# Patient Record
Sex: Female | Born: 1945
Health system: Southern US, Community
[De-identification: ages and names within clinical notes are randomized; demographics above are authoritative.]

## PROBLEM LIST (undated history)

## (undated) DIAGNOSIS — I10 Essential (primary) hypertension: Secondary | ICD-10-CM

## (undated) DIAGNOSIS — J302 Other seasonal allergic rhinitis: Secondary | ICD-10-CM

## (undated) HISTORY — DX: Essential (primary) hypertension: I10

## (undated) HISTORY — DX: Other seasonal allergic rhinitis: J30.2

---

## 2008-11-05 ENCOUNTER — Other Ambulatory Visit: Admission: RE | Admit: 2008-11-05 | Discharge: 2008-11-05 | Payer: Self-pay | Admitting: Family Medicine

## 2009-02-09 ENCOUNTER — Other Ambulatory Visit: Admission: RE | Admit: 2009-02-09 | Discharge: 2009-02-09 | Payer: Self-pay | Admitting: Family Medicine

## 2014-08-05 ENCOUNTER — Other Ambulatory Visit: Payer: Self-pay | Admitting: Family Medicine

## 2014-08-05 DIAGNOSIS — Z1239 Encounter for other screening for malignant neoplasm of breast: Secondary | ICD-10-CM

## 2014-08-19 ENCOUNTER — Other Ambulatory Visit: Payer: Self-pay | Admitting: Family Medicine

## 2014-08-19 DIAGNOSIS — Z1231 Encounter for screening mammogram for malignant neoplasm of breast: Secondary | ICD-10-CM

## 2014-08-25 ENCOUNTER — Ambulatory Visit
Admission: RE | Admit: 2014-08-25 | Discharge: 2014-08-25 | Disposition: A | Discharge status: Home / Self Care | Payer: Medicare Other | Source: Ambulatory Visit | Attending: Family Medicine | Admitting: Family Medicine

## 2014-08-25 DIAGNOSIS — Z1231 Encounter for screening mammogram for malignant neoplasm of breast: Secondary | ICD-10-CM

## 2014-08-27 ENCOUNTER — Other Ambulatory Visit: Payer: Self-pay | Admitting: Family Medicine

## 2014-08-27 DIAGNOSIS — R928 Other abnormal and inconclusive findings on diagnostic imaging of breast: Secondary | ICD-10-CM

## 2014-09-07 ENCOUNTER — Other Ambulatory Visit: Payer: Self-pay | Admitting: Orthopaedic Surgery

## 2014-09-07 DIAGNOSIS — M25512 Pain in left shoulder: Secondary | ICD-10-CM

## 2014-09-10 ENCOUNTER — Ambulatory Visit
Admission: RE | Admit: 2014-09-10 | Discharge: 2014-09-10 | Disposition: A | Discharge status: Home / Self Care | Payer: Medicare Other | Source: Ambulatory Visit | Attending: Family Medicine | Admitting: Family Medicine

## 2014-09-10 ENCOUNTER — Other Ambulatory Visit: Payer: Self-pay | Admitting: Family Medicine

## 2014-09-10 DIAGNOSIS — R928 Other abnormal and inconclusive findings on diagnostic imaging of breast: Secondary | ICD-10-CM

## 2014-09-22 ENCOUNTER — Ambulatory Visit
Admission: RE | Admit: 2014-09-22 | Discharge: 2014-09-22 | Disposition: A | Discharge status: Home / Self Care | Payer: Medicare Other | Source: Ambulatory Visit | Attending: Family Medicine | Admitting: Family Medicine

## 2014-09-22 DIAGNOSIS — R928 Other abnormal and inconclusive findings on diagnostic imaging of breast: Secondary | ICD-10-CM

## 2014-10-04 ENCOUNTER — Ambulatory Visit
Admission: RE | Admit: 2014-10-04 | Discharge: 2014-10-04 | Disposition: A | Discharge status: Home / Self Care | Payer: Medicare Other | Source: Ambulatory Visit | Attending: Orthopaedic Surgery | Admitting: Orthopaedic Surgery

## 2014-10-04 DIAGNOSIS — M25512 Pain in left shoulder: Secondary | ICD-10-CM

## 2015-06-20 ENCOUNTER — Other Ambulatory Visit: Payer: Self-pay | Admitting: Family Medicine

## 2015-06-20 DIAGNOSIS — N6489 Other specified disorders of breast: Secondary | ICD-10-CM

## 2015-06-20 DIAGNOSIS — R921 Mammographic calcification found on diagnostic imaging of breast: Secondary | ICD-10-CM

## 2015-06-24 ENCOUNTER — Ambulatory Visit
Admission: RE | Admit: 2015-06-24 | Discharge: 2015-06-24 | Disposition: A | Discharge status: Home / Self Care | Payer: Medicare Other | Source: Ambulatory Visit | Attending: Family Medicine | Admitting: Family Medicine

## 2015-06-24 ENCOUNTER — Other Ambulatory Visit: Payer: Self-pay | Admitting: Family Medicine

## 2015-06-24 DIAGNOSIS — N6489 Other specified disorders of breast: Secondary | ICD-10-CM

## 2015-06-24 DIAGNOSIS — R921 Mammographic calcification found on diagnostic imaging of breast: Secondary | ICD-10-CM

## 2015-08-30 ENCOUNTER — Other Ambulatory Visit: Payer: Self-pay | Admitting: Family Medicine

## 2015-08-30 ENCOUNTER — Other Ambulatory Visit: Payer: Self-pay

## 2015-08-30 DIAGNOSIS — N6489 Other specified disorders of breast: Secondary | ICD-10-CM

## 2015-09-30 ENCOUNTER — Ambulatory Visit
Admission: RE | Admit: 2015-09-30 | Discharge: 2015-09-30 | Disposition: A | Discharge status: Home / Self Care | Payer: Medicare Other | Source: Ambulatory Visit | Attending: Family Medicine | Admitting: Family Medicine

## 2015-09-30 DIAGNOSIS — N6489 Other specified disorders of breast: Secondary | ICD-10-CM

## 2015-11-01 DIAGNOSIS — M25562 Pain in left knee: Secondary | ICD-10-CM | POA: Diagnosis not present

## 2015-11-01 DIAGNOSIS — M79672 Pain in left foot: Secondary | ICD-10-CM | POA: Diagnosis not present

## 2015-11-01 DIAGNOSIS — M7552 Bursitis of left shoulder: Secondary | ICD-10-CM | POA: Diagnosis not present

## 2015-11-16 DIAGNOSIS — M25562 Pain in left knee: Secondary | ICD-10-CM | POA: Diagnosis not present

## 2015-12-16 DIAGNOSIS — M25562 Pain in left knee: Secondary | ICD-10-CM | POA: Diagnosis not present

## 2016-01-10 DIAGNOSIS — M7062 Trochanteric bursitis, left hip: Secondary | ICD-10-CM | POA: Diagnosis not present

## 2016-01-13 ENCOUNTER — Other Ambulatory Visit: Payer: Self-pay | Admitting: Orthopaedic Surgery

## 2016-01-13 DIAGNOSIS — R200 Anesthesia of skin: Secondary | ICD-10-CM

## 2016-01-16 ENCOUNTER — Other Ambulatory Visit: Payer: Self-pay | Admitting: Orthopaedic Surgery

## 2016-01-16 DIAGNOSIS — M25562 Pain in left knee: Secondary | ICD-10-CM

## 2016-01-20 ENCOUNTER — Ambulatory Visit
Admission: RE | Admit: 2016-01-20 | Discharge: 2016-01-20 | Disposition: A | Discharge status: Home / Self Care | Payer: PPO | Source: Ambulatory Visit | Attending: Orthopaedic Surgery | Admitting: Orthopaedic Surgery

## 2016-01-20 DIAGNOSIS — L608 Other nail disorders: Secondary | ICD-10-CM | POA: Diagnosis not present

## 2016-01-20 DIAGNOSIS — R200 Anesthesia of skin: Secondary | ICD-10-CM

## 2016-01-27 ENCOUNTER — Ambulatory Visit
Admission: RE | Admit: 2016-01-27 | Discharge: 2016-01-27 | Disposition: A | Discharge status: Home / Self Care | Payer: PPO | Source: Ambulatory Visit | Attending: Orthopaedic Surgery | Admitting: Orthopaedic Surgery

## 2016-01-27 DIAGNOSIS — M25562 Pain in left knee: Secondary | ICD-10-CM

## 2016-01-27 DIAGNOSIS — S83242A Other tear of medial meniscus, current injury, left knee, initial encounter: Secondary | ICD-10-CM | POA: Diagnosis not present

## 2016-02-03 DIAGNOSIS — M79672 Pain in left foot: Secondary | ICD-10-CM | POA: Diagnosis not present

## 2016-02-03 DIAGNOSIS — M1712 Unilateral primary osteoarthritis, left knee: Secondary | ICD-10-CM | POA: Diagnosis not present

## 2016-02-03 DIAGNOSIS — M25562 Pain in left knee: Secondary | ICD-10-CM | POA: Diagnosis not present

## 2016-02-03 DIAGNOSIS — M23322 Other meniscus derangements, posterior horn of medial meniscus, left knee: Secondary | ICD-10-CM | POA: Diagnosis not present

## 2016-02-13 DIAGNOSIS — R7303 Prediabetes: Secondary | ICD-10-CM | POA: Diagnosis not present

## 2016-02-13 DIAGNOSIS — F39 Unspecified mood [affective] disorder: Secondary | ICD-10-CM | POA: Diagnosis not present

## 2016-02-13 DIAGNOSIS — I10 Essential (primary) hypertension: Secondary | ICD-10-CM | POA: Diagnosis not present

## 2016-02-13 DIAGNOSIS — Z1159 Encounter for screening for other viral diseases: Secondary | ICD-10-CM | POA: Diagnosis not present

## 2016-02-13 DIAGNOSIS — E7800 Pure hypercholesterolemia, unspecified: Secondary | ICD-10-CM | POA: Diagnosis not present

## 2016-02-13 DIAGNOSIS — K219 Gastro-esophageal reflux disease without esophagitis: Secondary | ICD-10-CM | POA: Diagnosis not present

## 2016-02-13 DIAGNOSIS — R7309 Other abnormal glucose: Secondary | ICD-10-CM | POA: Diagnosis not present

## 2016-05-29 DIAGNOSIS — J329 Chronic sinusitis, unspecified: Secondary | ICD-10-CM | POA: Diagnosis not present

## 2016-09-04 ENCOUNTER — Other Ambulatory Visit: Payer: Self-pay | Admitting: Family Medicine

## 2016-09-04 DIAGNOSIS — F39 Unspecified mood [affective] disorder: Secondary | ICD-10-CM | POA: Diagnosis not present

## 2016-09-04 DIAGNOSIS — Z1231 Encounter for screening mammogram for malignant neoplasm of breast: Secondary | ICD-10-CM

## 2016-09-04 DIAGNOSIS — J309 Allergic rhinitis, unspecified: Secondary | ICD-10-CM | POA: Diagnosis not present

## 2016-09-04 DIAGNOSIS — K219 Gastro-esophageal reflux disease without esophagitis: Secondary | ICD-10-CM | POA: Diagnosis not present

## 2016-09-04 DIAGNOSIS — Z23 Encounter for immunization: Secondary | ICD-10-CM | POA: Diagnosis not present

## 2016-09-04 DIAGNOSIS — I10 Essential (primary) hypertension: Secondary | ICD-10-CM | POA: Diagnosis not present

## 2016-09-04 DIAGNOSIS — R7303 Prediabetes: Secondary | ICD-10-CM | POA: Diagnosis not present

## 2016-09-04 DIAGNOSIS — E7800 Pure hypercholesterolemia, unspecified: Secondary | ICD-10-CM | POA: Diagnosis not present

## 2016-09-04 DIAGNOSIS — N951 Menopausal and female climacteric states: Secondary | ICD-10-CM | POA: Diagnosis not present

## 2016-09-04 DIAGNOSIS — Z0000 Encounter for general adult medical examination without abnormal findings: Secondary | ICD-10-CM | POA: Diagnosis not present

## 2016-10-08 ENCOUNTER — Ambulatory Visit: Payer: PPO

## 2016-10-27 DIAGNOSIS — Z79899 Other long term (current) drug therapy: Secondary | ICD-10-CM | POA: Diagnosis not present

## 2016-10-27 DIAGNOSIS — R21 Rash and other nonspecific skin eruption: Secondary | ICD-10-CM | POA: Diagnosis not present

## 2016-10-27 DIAGNOSIS — I10 Essential (primary) hypertension: Secondary | ICD-10-CM | POA: Diagnosis not present

## 2016-10-27 DIAGNOSIS — K219 Gastro-esophageal reflux disease without esophagitis: Secondary | ICD-10-CM | POA: Diagnosis not present

## 2016-10-27 DIAGNOSIS — L509 Urticaria, unspecified: Secondary | ICD-10-CM | POA: Diagnosis not present

## 2016-10-27 DIAGNOSIS — E7800 Pure hypercholesterolemia, unspecified: Secondary | ICD-10-CM | POA: Diagnosis not present

## 2016-11-05 DIAGNOSIS — L509 Urticaria, unspecified: Secondary | ICD-10-CM | POA: Diagnosis not present

## 2016-11-09 ENCOUNTER — Ambulatory Visit
Admission: RE | Admit: 2016-11-09 | Discharge: 2016-11-09 | Disposition: A | Discharge status: Home / Self Care | Payer: PPO | Source: Ambulatory Visit | Attending: Family Medicine | Admitting: Family Medicine

## 2016-11-09 DIAGNOSIS — Z1231 Encounter for screening mammogram for malignant neoplasm of breast: Secondary | ICD-10-CM

## 2016-11-15 ENCOUNTER — Other Ambulatory Visit: Payer: Self-pay | Admitting: Family Medicine

## 2016-11-15 DIAGNOSIS — R928 Other abnormal and inconclusive findings on diagnostic imaging of breast: Secondary | ICD-10-CM

## 2016-11-19 ENCOUNTER — Ambulatory Visit
Admission: RE | Admit: 2016-11-19 | Discharge: 2016-11-19 | Disposition: A | Discharge status: Home / Self Care | Payer: PPO | Source: Ambulatory Visit | Attending: Family Medicine | Admitting: Family Medicine

## 2016-11-19 DIAGNOSIS — R928 Other abnormal and inconclusive findings on diagnostic imaging of breast: Secondary | ICD-10-CM

## 2016-11-19 DIAGNOSIS — R922 Inconclusive mammogram: Secondary | ICD-10-CM | POA: Diagnosis not present

## 2016-11-28 DIAGNOSIS — J069 Acute upper respiratory infection, unspecified: Secondary | ICD-10-CM | POA: Diagnosis not present

## 2017-02-06 ENCOUNTER — Ambulatory Visit (INDEPENDENT_AMBULATORY_CARE_PROVIDER_SITE_OTHER): Payer: PPO

## 2017-02-06 ENCOUNTER — Encounter (INDEPENDENT_AMBULATORY_CARE_PROVIDER_SITE_OTHER): Payer: Self-pay | Admitting: Orthopedic Surgery

## 2017-02-06 ENCOUNTER — Ambulatory Visit (INDEPENDENT_AMBULATORY_CARE_PROVIDER_SITE_OTHER): Payer: PPO | Admitting: Orthopedic Surgery

## 2017-02-06 VITALS — BP 142/79 | HR 69 | Resp 14 | Ht 60.5 in | Wt 200.0 lb

## 2017-02-06 DIAGNOSIS — M25561 Pain in right knee: Secondary | ICD-10-CM | POA: Diagnosis not present

## 2017-02-06 DIAGNOSIS — M25571 Pain in right ankle and joints of right foot: Secondary | ICD-10-CM

## 2017-02-06 DIAGNOSIS — M25572 Pain in left ankle and joints of left foot: Secondary | ICD-10-CM

## 2017-02-06 MED ORDER — BUPIVACAINE HCL 0.5 % IJ SOLN
3.0000 mL | INTRAMUSCULAR | Status: AC | PRN
  Administered 2017-02-06: 3 mL via INTRA_ARTICULAR

## 2017-02-06 MED ORDER — METHYLPREDNISOLONE ACETATE 40 MG/ML IJ SUSP
80.0000 mg | INTRAMUSCULAR | Status: AC | PRN
  Administered 2017-02-06: 80 mg

## 2017-02-06 MED ORDER — LIDOCAINE HCL 1 % IJ SOLN
5.0000 mL | INTRAMUSCULAR | Status: AC | PRN
  Administered 2017-02-06: 5 mL

## 2017-02-06 NOTE — Progress Notes (Signed)
Office Visit Note   Patient: Evelyn Stevens           Date of Birth: 04/23/1946           MRN: [id number] Visit Date: 02/06/2017              Requested by: No referring provider defined for this encounter. PCP: Osborne Casco, MD   Assessment & Plan: Visit Diagnoses:  1. Acute pain of right knee     Plan:  #1: Corticosteroid injection to the right knee which was accomplished atraumatically #2: If this is not having any improvement then she can call and we'll get an MRI scan #3: She does have a set of exercises that she had for her left knee and she has been trying to do those and will continue.  Follow-Up Instructions: Return if symptoms worsen or fail to improve.   Orders:  Orders Placed This Encounter  Procedures  . XR Knee Complete 4 Views Right   No orders of the defined types were placed in this encounter.     Procedures: Large Joint Inj Date/Time: 02/06/2017 5:10 PM Performed by: Biagio Borg D Authorized by: Biagio Borg D   Consent Given by:  Patient Timeout: prior to procedure the correct patient, procedure, and site was verified   Indications:  Pain and joint swelling Location:  Knee Site:  R knee Prep: patient was prepped and draped in usual sterile fashion   Needle Size:  25 G Needle Length:  1.5 inches Approach:  Anteromedial Ultrasound Guidance: No   Fluoroscopic Guidance: No   Arthrogram: No   Medications:  5 mL lidocaine 1 %; 80 mg methylPREDNISolone acetate 40 MG/ML; 3 mL bupivacaine 0.5 % Aspiration Attempted: No   Patient tolerance:  Patient tolerated the procedure well with no immediate complications     Clinical Data: No additional findings.   Subjective: Chief Complaint  Patient presents with  . Right Knee - Pain    Thank you is a 71 year old white female who is seen today for evaluation of her right knee. Apparently 10 days ago Friday she was walking down a flight of stairs at the movie theater and the one-step  was a little bit different and she came down very hard on her right knee. She really didn't remember anything at that time her pain however by the next morning she was having significant pain and discomfort in the right knee. This had improved Improved however on Friday, she was coming down her on steps and had an episode again of pain discomfort in the knee. She has now been using a 4 pronged cane to walk down the flight of stairs and she does not have any banisters. She continues to have pain laterally and some medially and posteriorly. She states though it is feeling some better today.    Review of Systems  Constitutional: Negative for fever. HENT: Negative for sore throat. Eyes: Negative for visual changes. Cardiovascular: Negative for chest pain. Respiratory: Negative for shortness of breath. Gastrointestinal: Negative for abdominal pain, vomiting or diarrhea. Genitourinary: Negative for dysuria. Musculoskeletal: Negative for back pain. Skin: Positive for irritation to skin on the face and neck. Neurological: Negative for headaches, weakness or numbness.     Objective: Vital Signs: BP (!) 142/79   Pulse 69   Resp 14   Ht 5' 0.5" (1.537 m)   Wt 200 lb (90.7 kg)   BMI 38.42 kg/m   Physical Exam  Constitutional: She is oriented to  person, place, and time. She appears well-developed and well-nourished.  HENT:  Head: Normocephalic and atraumatic.  Eyes: EOM are normal. Pupils are equal, round, and reactive to light.  Pulmonary/Chest: Effort normal.  Neurological: She is alert and oriented to person, place, and time.  Skin: Skin is warm and dry.  Psychiatric: She has a normal mood and affect. Her behavior is normal. Judgment and thought content normal.    Right Knee Exam   Tenderness  The patient is experiencing tenderness in the lateral joint line and medial joint line.  Range of Motion  Extension: 0  Flexion: 100   Tests  Varus: negative Valgus: negative  Other    Sensation: normal  Comments:  She has limited tenderness more laterally than medially. Little bit of crepitus with range of motion at the patellofemoral area. I cannot tell if she has no effusion or not because the size of her knee. Appears ligamentously stable.      Specialty Comments:  No specialty comments available.  Imaging: Xr Knee Complete 4 Views Right  Result Date: 02/06/2017 Three-view x-ray of the right knee reveals maintenance of the joint lines both medially and laterally. A little bit sclerosing along the medial tibial plateau. Does have little bit of spurring along the medial femoral condyle on the sunrise view.    PMFS History: There are no active problems to display for this patient.  Past Medical History:  Diagnosis Date  . Hypertension   . Seasonal allergies     No family history on file.  History reviewed. No pertinent surgical history. Social History   Occupational History  . Not on file.   Social History Main Topics  . Smoking status: Never Smoker  . Smokeless tobacco: Never Used  . Alcohol use No  . Drug use: No  . Sexual activity: Not on file

## 2017-03-08 DIAGNOSIS — J309 Allergic rhinitis, unspecified: Secondary | ICD-10-CM | POA: Diagnosis not present

## 2017-03-08 DIAGNOSIS — E7800 Pure hypercholesterolemia, unspecified: Secondary | ICD-10-CM | POA: Diagnosis not present

## 2017-03-08 DIAGNOSIS — R7303 Prediabetes: Secondary | ICD-10-CM | POA: Diagnosis not present

## 2017-03-08 DIAGNOSIS — K219 Gastro-esophageal reflux disease without esophagitis: Secondary | ICD-10-CM | POA: Diagnosis not present

## 2017-03-08 DIAGNOSIS — F39 Unspecified mood [affective] disorder: Secondary | ICD-10-CM | POA: Diagnosis not present

## 2017-03-08 DIAGNOSIS — I10 Essential (primary) hypertension: Secondary | ICD-10-CM | POA: Diagnosis not present

## 2017-04-17 DIAGNOSIS — L918 Other hypertrophic disorders of the skin: Secondary | ICD-10-CM | POA: Diagnosis not present

## 2017-04-17 DIAGNOSIS — L249 Irritant contact dermatitis, unspecified cause: Secondary | ICD-10-CM | POA: Diagnosis not present

## 2017-04-17 DIAGNOSIS — L814 Other melanin hyperpigmentation: Secondary | ICD-10-CM | POA: Diagnosis not present

## 2017-04-17 DIAGNOSIS — L820 Inflamed seborrheic keratosis: Secondary | ICD-10-CM | POA: Diagnosis not present

## 2017-04-17 DIAGNOSIS — D225 Melanocytic nevi of trunk: Secondary | ICD-10-CM | POA: Diagnosis not present

## 2017-05-30 ENCOUNTER — Other Ambulatory Visit: Payer: Self-pay | Admitting: Physician Assistant

## 2017-05-30 ENCOUNTER — Ambulatory Visit
Admission: RE | Admit: 2017-05-30 | Discharge: 2017-05-30 | Disposition: A | Discharge status: Home / Self Care | Payer: PPO | Source: Ambulatory Visit | Attending: Physician Assistant | Admitting: Physician Assistant

## 2017-05-30 DIAGNOSIS — R05 Cough: Secondary | ICD-10-CM

## 2017-05-30 DIAGNOSIS — R053 Chronic cough: Secondary | ICD-10-CM

## 2017-05-30 DIAGNOSIS — J310 Chronic rhinitis: Secondary | ICD-10-CM | POA: Diagnosis not present

## 2017-06-06 DIAGNOSIS — J209 Acute bronchitis, unspecified: Secondary | ICD-10-CM | POA: Diagnosis not present

## 2017-09-13 DIAGNOSIS — I129 Hypertensive chronic kidney disease with stage 1 through stage 4 chronic kidney disease, or unspecified chronic kidney disease: Secondary | ICD-10-CM | POA: Diagnosis not present

## 2017-09-13 DIAGNOSIS — R7303 Prediabetes: Secondary | ICD-10-CM | POA: Diagnosis not present

## 2017-09-13 DIAGNOSIS — Z23 Encounter for immunization: Secondary | ICD-10-CM | POA: Diagnosis not present

## 2017-09-13 DIAGNOSIS — F39 Unspecified mood [affective] disorder: Secondary | ICD-10-CM | POA: Diagnosis not present

## 2017-09-13 DIAGNOSIS — E7800 Pure hypercholesterolemia, unspecified: Secondary | ICD-10-CM | POA: Diagnosis not present

## 2017-09-13 DIAGNOSIS — Z0000 Encounter for general adult medical examination without abnormal findings: Secondary | ICD-10-CM | POA: Diagnosis not present

## 2017-09-13 DIAGNOSIS — N183 Chronic kidney disease, stage 3 (moderate): Secondary | ICD-10-CM | POA: Diagnosis not present

## 2017-09-13 DIAGNOSIS — J309 Allergic rhinitis, unspecified: Secondary | ICD-10-CM | POA: Diagnosis not present

## 2017-10-28 ENCOUNTER — Telehealth (INDEPENDENT_AMBULATORY_CARE_PROVIDER_SITE_OTHER): Payer: Self-pay | Admitting: Orthopaedic Surgery

## 2017-10-28 NOTE — Telephone Encounter (Signed)
Patient left a message requesting an additional pair of orthotics.  She wants to know if she needs to make an appointment for this or

## 2017-10-28 NOTE — Telephone Encounter (Signed)
Is insurance billed or does she need to pay for them when she comes in.  CB# [phone]  Please advise

## 2017-10-30 ENCOUNTER — Telehealth (INDEPENDENT_AMBULATORY_CARE_PROVIDER_SITE_OTHER): Payer: Self-pay | Admitting: Orthopaedic Surgery

## 2017-10-30 NOTE — Telephone Encounter (Signed)
OK per Dr. Durward Fortes, [facility] to call [facility] and ask to schedule

## 2017-10-30 NOTE — Telephone Encounter (Signed)
Patient would like a new rx for Orthotics. Old pair is wearing out. Please call to advise.

## 2017-10-30 NOTE — Telephone Encounter (Signed)
OK to do-

## 2017-10-30 NOTE — Telephone Encounter (Signed)
yes

## 2017-11-04 ENCOUNTER — Other Ambulatory Visit (INDEPENDENT_AMBULATORY_CARE_PROVIDER_SITE_OTHER): Payer: Self-pay | Admitting: *Deleted

## 2017-11-04 DIAGNOSIS — M25571 Pain in right ankle and joints of right foot: Secondary | ICD-10-CM | POA: Diagnosis not present

## 2017-11-04 DIAGNOSIS — M25572 Pain in left ankle and joints of left foot: Secondary | ICD-10-CM

## 2017-11-05 ENCOUNTER — Other Ambulatory Visit (INDEPENDENT_AMBULATORY_CARE_PROVIDER_SITE_OTHER): Payer: Self-pay | Admitting: *Deleted

## 2017-11-05 ENCOUNTER — Other Ambulatory Visit (INDEPENDENT_AMBULATORY_CARE_PROVIDER_SITE_OTHER): Payer: Self-pay

## 2017-11-19 DIAGNOSIS — M8588 Other specified disorders of bone density and structure, other site: Secondary | ICD-10-CM | POA: Diagnosis not present

## 2017-11-25 ENCOUNTER — Other Ambulatory Visit: Payer: Self-pay | Admitting: Family Medicine

## 2017-11-25 DIAGNOSIS — Z139 Encounter for screening, unspecified: Secondary | ICD-10-CM

## 2017-12-11 ENCOUNTER — Encounter (INDEPENDENT_AMBULATORY_CARE_PROVIDER_SITE_OTHER): Payer: Self-pay

## 2017-12-11 ENCOUNTER — Ambulatory Visit
Admission: RE | Admit: 2017-12-11 | Discharge: 2017-12-11 | Disposition: A | Discharge status: Home / Self Care | Payer: PPO | Source: Ambulatory Visit | Attending: Family Medicine | Admitting: Family Medicine

## 2017-12-11 DIAGNOSIS — Z139 Encounter for screening, unspecified: Secondary | ICD-10-CM

## 2017-12-11 DIAGNOSIS — Z1231 Encounter for screening mammogram for malignant neoplasm of breast: Secondary | ICD-10-CM | POA: Diagnosis not present

## 2017-12-15 IMAGING — MG 2D DIGITAL DIAGNOSTIC UNILATERAL RIGHT MAMMOGRAM WITH CAD AND AD
6 of 9 series · 6 of 25 positions shown · non-contrast
Comparison: Previous exam(s).

CLINICAL DATA: 70-year-old female, callback from screening
mammogram for possible right breast distortion

EXAM:
2D DIGITAL DIAGNOSTIC UNILATERAL RIGHT MAMMOGRAM WITH CAD AND
ADJUNCT TOMO

[R MLO]
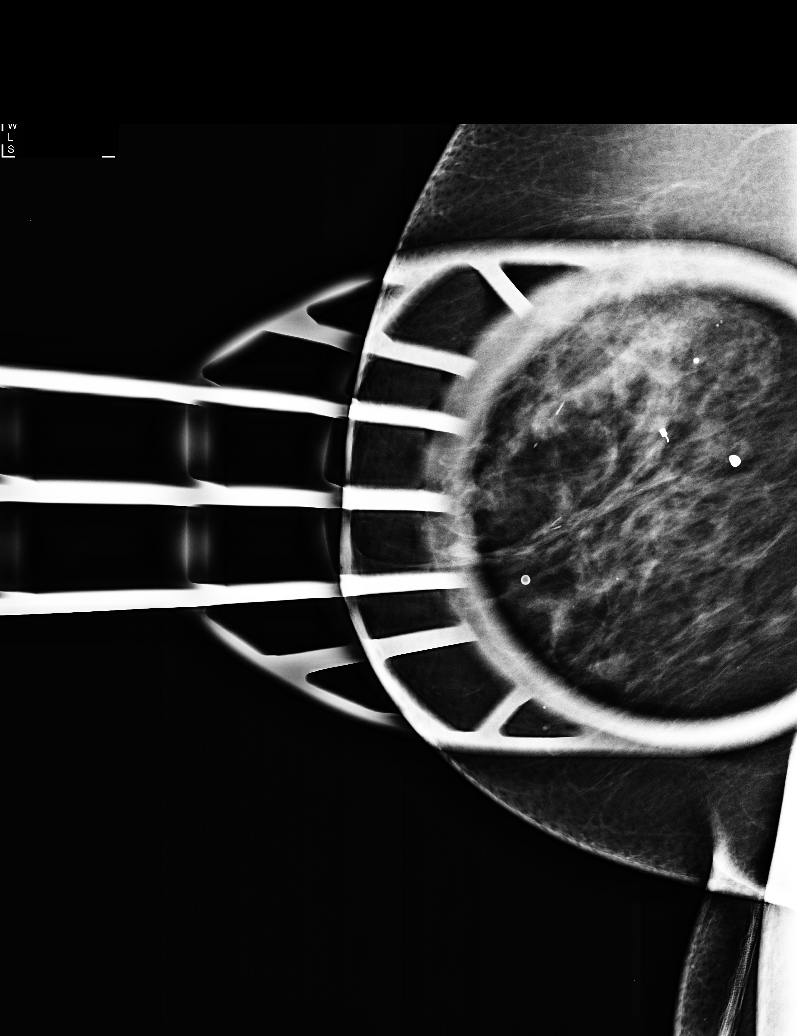

[R CC (1 of 2)]
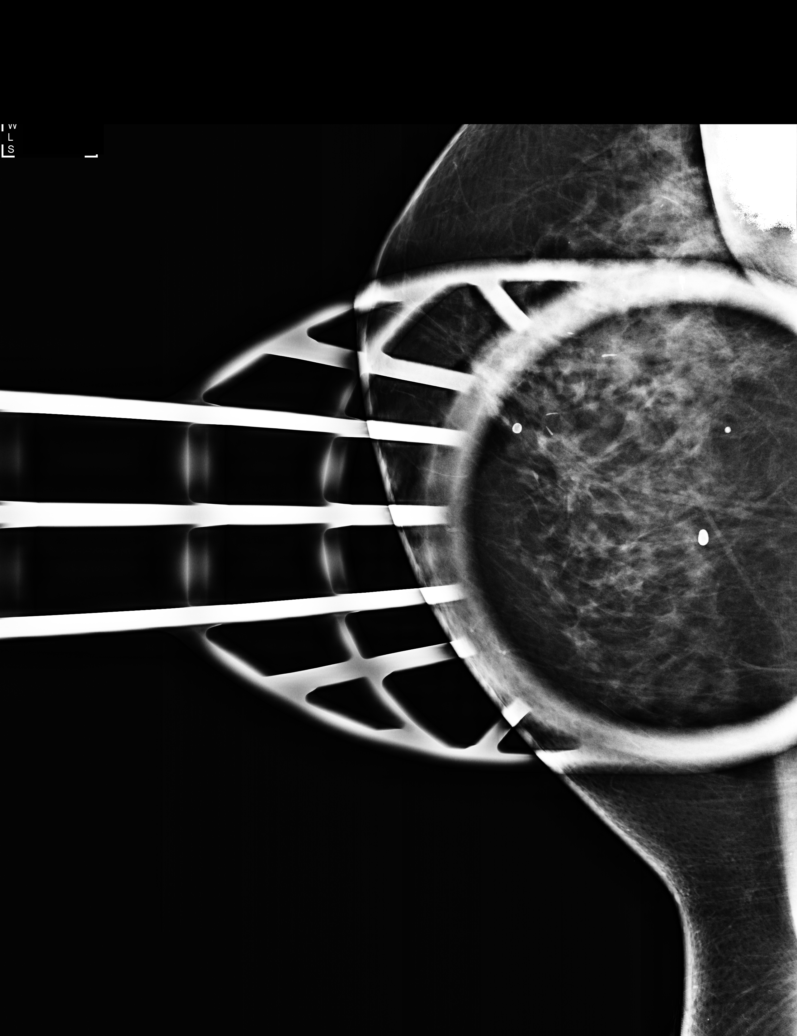

[R ML synth-2D]
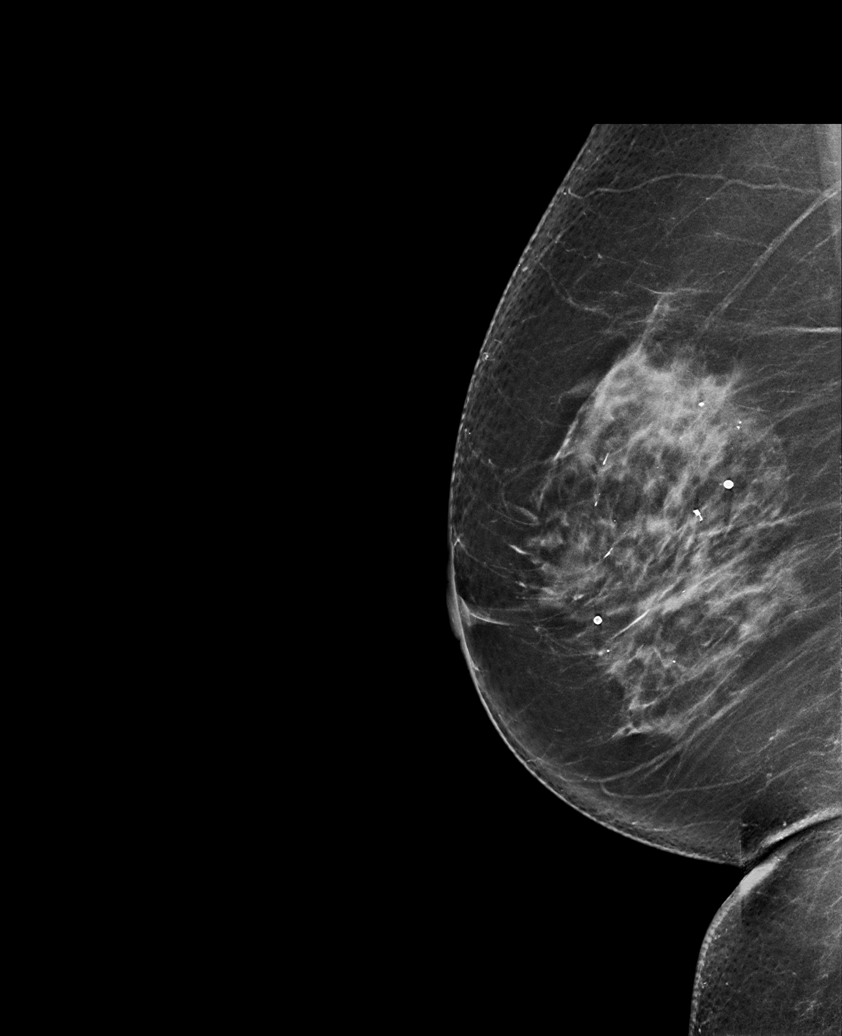

[R ML]
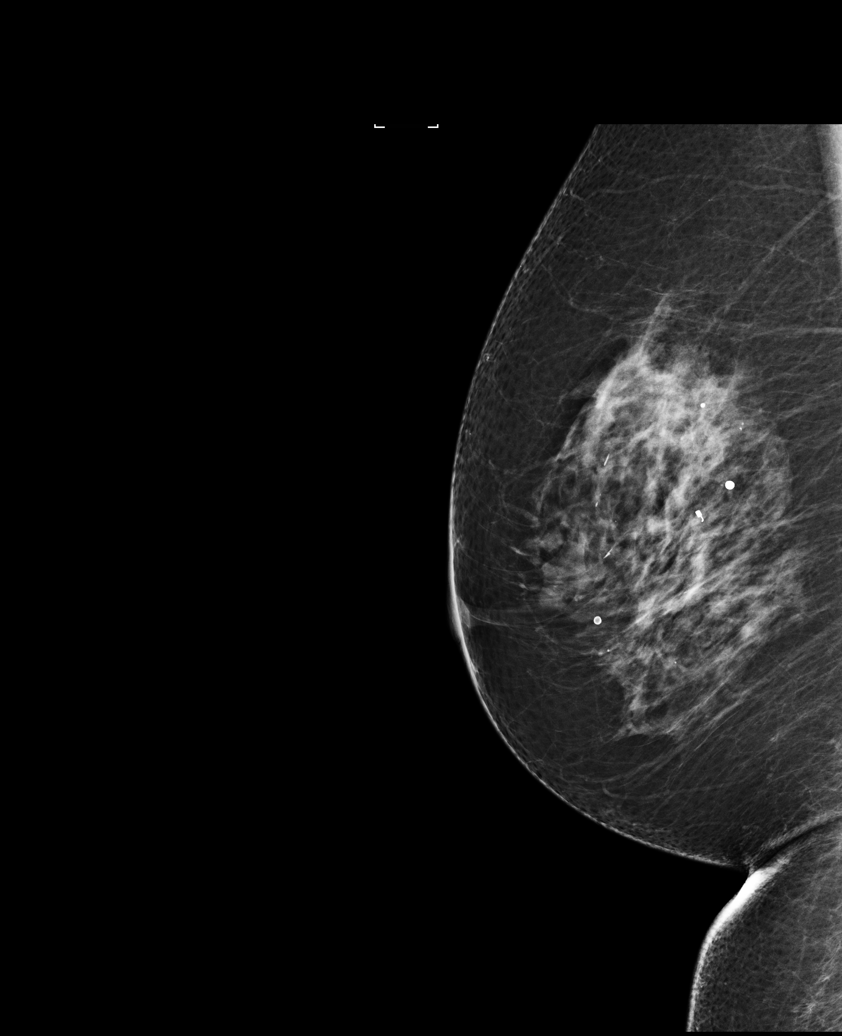

[R CC (2 of 2)]
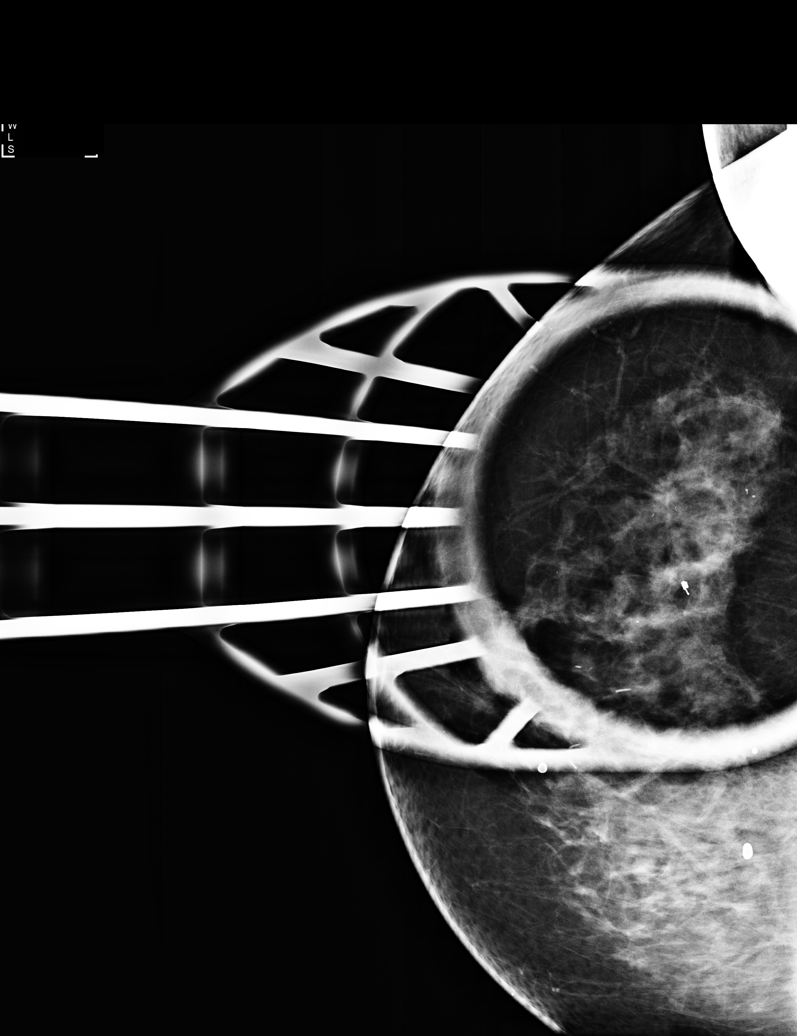

[R CC tomo · tomo slice 43/84.0]
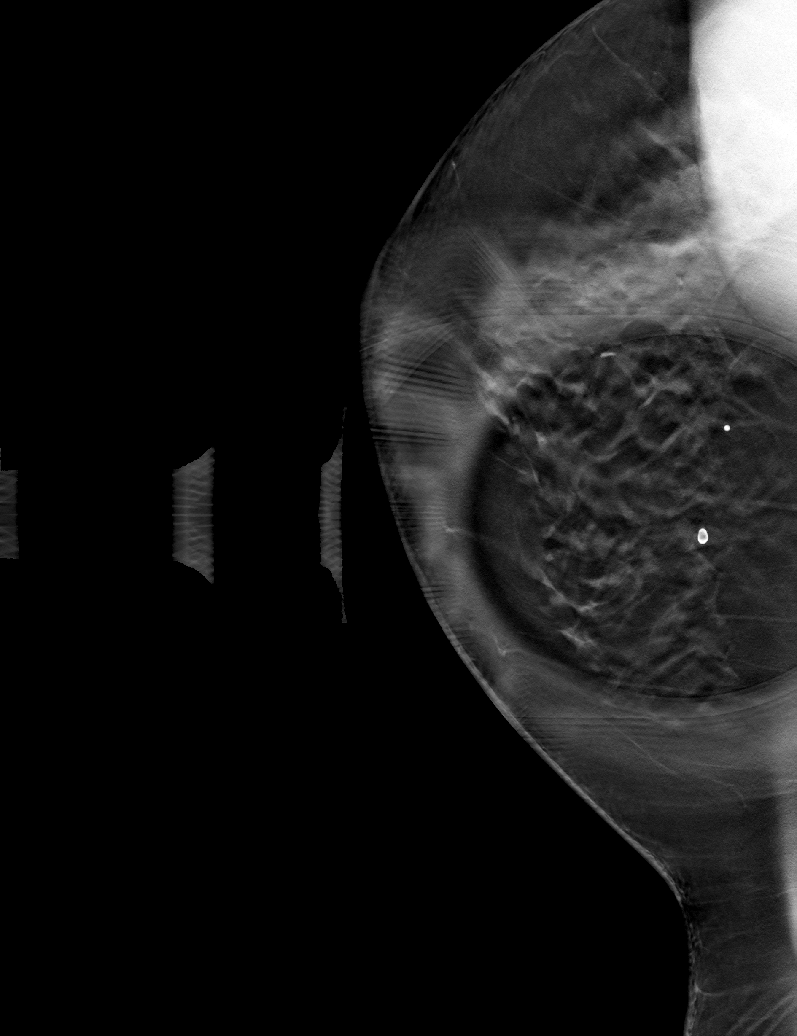

[6 of 25 positions shown; findings below may reference images not displayed]

ACR Breast Density Category c: The breast tissue is heterogeneously
dense, which may obscure small masses.
FINDINGS: Cc and MLO spot-compression and full lateral views of the right
breast were performed with tomosynthesis. No suspicious abnormality
is identified on the additional views. The area of concern on the CC
view of the screening mammogram mammogram corresponds to the site of
prior benign stereotactic biopsy. The biopsy marker was noted on the
post biopsy mammogram to be medial to the biopsy site.

Mammographic images were processed with CAD.
IMPRESSION: No mammographic evidence of malignancy.

RECOMMENDATION:
Screening mammogram in one year.(Code:92-[id number])

I have discussed the findings and recommendations with the patient.
Results were also provided in writing at the conclusion of the
visit. If applicable, a reminder letter will be sent to the patient
regarding the next appointment.

BI-RADS CATEGORY  2: Benign.

## 2018-03-18 DIAGNOSIS — E7800 Pure hypercholesterolemia, unspecified: Secondary | ICD-10-CM | POA: Diagnosis not present

## 2018-03-18 DIAGNOSIS — R7303 Prediabetes: Secondary | ICD-10-CM | POA: Diagnosis not present

## 2018-03-18 DIAGNOSIS — I129 Hypertensive chronic kidney disease with stage 1 through stage 4 chronic kidney disease, or unspecified chronic kidney disease: Secondary | ICD-10-CM | POA: Diagnosis not present

## 2018-03-18 DIAGNOSIS — N183 Chronic kidney disease, stage 3 (moderate): Secondary | ICD-10-CM | POA: Diagnosis not present

## 2018-03-31 DIAGNOSIS — L509 Urticaria, unspecified: Secondary | ICD-10-CM | POA: Diagnosis not present

## 2018-04-28 DIAGNOSIS — L821 Other seborrheic keratosis: Secondary | ICD-10-CM | POA: Diagnosis not present

## 2018-04-28 DIAGNOSIS — L570 Actinic keratosis: Secondary | ICD-10-CM | POA: Diagnosis not present

## 2018-04-28 DIAGNOSIS — L819 Disorder of pigmentation, unspecified: Secondary | ICD-10-CM | POA: Diagnosis not present

## 2018-05-06 DIAGNOSIS — H33321 Round hole, right eye: Secondary | ICD-10-CM | POA: Diagnosis not present

## 2018-05-06 DIAGNOSIS — H25813 Combined forms of age-related cataract, bilateral: Secondary | ICD-10-CM | POA: Diagnosis not present

## 2018-05-06 DIAGNOSIS — H04123 Dry eye syndrome of bilateral lacrimal glands: Secondary | ICD-10-CM | POA: Diagnosis not present

## 2018-05-06 DIAGNOSIS — E119 Type 2 diabetes mellitus without complications: Secondary | ICD-10-CM | POA: Diagnosis not present

## 2018-05-06 DIAGNOSIS — H10413 Chronic giant papillary conjunctivitis, bilateral: Secondary | ICD-10-CM | POA: Diagnosis not present

## 2018-05-09 ENCOUNTER — Encounter (INDEPENDENT_AMBULATORY_CARE_PROVIDER_SITE_OTHER): Payer: PPO | Admitting: Ophthalmology

## 2018-05-09 DIAGNOSIS — H2513 Age-related nuclear cataract, bilateral: Secondary | ICD-10-CM

## 2018-05-09 DIAGNOSIS — H43813 Vitreous degeneration, bilateral: Secondary | ICD-10-CM

## 2018-05-09 DIAGNOSIS — H35033 Hypertensive retinopathy, bilateral: Secondary | ICD-10-CM | POA: Diagnosis not present

## 2018-05-09 DIAGNOSIS — I10 Essential (primary) hypertension: Secondary | ICD-10-CM | POA: Diagnosis not present

## 2018-05-09 DIAGNOSIS — H33021 Retinal detachment with multiple breaks, right eye: Secondary | ICD-10-CM | POA: Diagnosis not present

## 2018-05-16 ENCOUNTER — Encounter (INDEPENDENT_AMBULATORY_CARE_PROVIDER_SITE_OTHER): Payer: PPO | Admitting: Ophthalmology

## 2018-05-16 DIAGNOSIS — H33301 Unspecified retinal break, right eye: Secondary | ICD-10-CM

## 2018-06-06 ENCOUNTER — Encounter (INDEPENDENT_AMBULATORY_CARE_PROVIDER_SITE_OTHER): Payer: PPO | Admitting: Ophthalmology

## 2018-06-06 DIAGNOSIS — H33301 Unspecified retinal break, right eye: Secondary | ICD-10-CM

## 2018-09-16 DIAGNOSIS — Z0000 Encounter for general adult medical examination without abnormal findings: Secondary | ICD-10-CM | POA: Diagnosis not present

## 2018-09-16 DIAGNOSIS — N183 Chronic kidney disease, stage 3 (moderate): Secondary | ICD-10-CM | POA: Diagnosis not present

## 2018-09-16 DIAGNOSIS — E7800 Pure hypercholesterolemia, unspecified: Secondary | ICD-10-CM | POA: Diagnosis not present

## 2018-09-16 DIAGNOSIS — I129 Hypertensive chronic kidney disease with stage 1 through stage 4 chronic kidney disease, or unspecified chronic kidney disease: Secondary | ICD-10-CM | POA: Diagnosis not present

## 2018-09-16 DIAGNOSIS — Z1389 Encounter for screening for other disorder: Secondary | ICD-10-CM | POA: Diagnosis not present

## 2018-09-16 DIAGNOSIS — J309 Allergic rhinitis, unspecified: Secondary | ICD-10-CM | POA: Diagnosis not present

## 2018-09-16 DIAGNOSIS — F39 Unspecified mood [affective] disorder: Secondary | ICD-10-CM | POA: Diagnosis not present

## 2018-09-16 DIAGNOSIS — K219 Gastro-esophageal reflux disease without esophagitis: Secondary | ICD-10-CM | POA: Diagnosis not present

## 2018-09-16 DIAGNOSIS — R7303 Prediabetes: Secondary | ICD-10-CM | POA: Diagnosis not present

## 2018-09-17 ENCOUNTER — Encounter (INDEPENDENT_AMBULATORY_CARE_PROVIDER_SITE_OTHER): Payer: PPO | Admitting: Ophthalmology

## 2018-09-17 DIAGNOSIS — I10 Essential (primary) hypertension: Secondary | ICD-10-CM | POA: Diagnosis not present

## 2018-09-17 DIAGNOSIS — H43813 Vitreous degeneration, bilateral: Secondary | ICD-10-CM | POA: Diagnosis not present

## 2018-09-17 DIAGNOSIS — H35033 Hypertensive retinopathy, bilateral: Secondary | ICD-10-CM | POA: Diagnosis not present

## 2018-09-17 DIAGNOSIS — H2513 Age-related nuclear cataract, bilateral: Secondary | ICD-10-CM

## 2018-09-17 DIAGNOSIS — H33301 Unspecified retinal break, right eye: Secondary | ICD-10-CM

## 2018-10-09 DIAGNOSIS — R21 Rash and other nonspecific skin eruption: Secondary | ICD-10-CM | POA: Diagnosis not present

## 2018-12-03 ENCOUNTER — Other Ambulatory Visit: Payer: Self-pay | Admitting: Family Medicine

## 2018-12-03 DIAGNOSIS — Z1231 Encounter for screening mammogram for malignant neoplasm of breast: Secondary | ICD-10-CM

## 2018-12-29 ENCOUNTER — Ambulatory Visit
Admission: RE | Admit: 2018-12-29 | Discharge: 2018-12-29 | Disposition: A | Discharge status: Home / Self Care | Payer: PPO | Source: Ambulatory Visit | Attending: Family Medicine | Admitting: Family Medicine

## 2018-12-29 DIAGNOSIS — Z1231 Encounter for screening mammogram for malignant neoplasm of breast: Secondary | ICD-10-CM | POA: Diagnosis not present

## 2019-06-04 DIAGNOSIS — R21 Rash and other nonspecific skin eruption: Secondary | ICD-10-CM | POA: Diagnosis not present

## 2019-06-15 DIAGNOSIS — L738 Other specified follicular disorders: Secondary | ICD-10-CM | POA: Diagnosis not present

## 2019-06-15 DIAGNOSIS — L509 Urticaria, unspecified: Secondary | ICD-10-CM | POA: Diagnosis not present

## 2019-08-31 DIAGNOSIS — L308 Other specified dermatitis: Secondary | ICD-10-CM | POA: Diagnosis not present

## 2019-08-31 DIAGNOSIS — D485 Neoplasm of uncertain behavior of skin: Secondary | ICD-10-CM | POA: Diagnosis not present

## 2019-09-08 DIAGNOSIS — L249 Irritant contact dermatitis, unspecified cause: Secondary | ICD-10-CM | POA: Diagnosis not present

## 2019-09-08 DIAGNOSIS — L308 Other specified dermatitis: Secondary | ICD-10-CM | POA: Diagnosis not present

## 2019-09-08 DIAGNOSIS — L080 Pyoderma: Secondary | ICD-10-CM | POA: Diagnosis not present

## 2019-09-14 DIAGNOSIS — L308 Other specified dermatitis: Secondary | ICD-10-CM | POA: Diagnosis not present

## 2019-09-14 DIAGNOSIS — Z0182 Encounter for allergy testing: Secondary | ICD-10-CM | POA: Diagnosis not present

## 2019-09-14 DIAGNOSIS — L259 Unspecified contact dermatitis, unspecified cause: Secondary | ICD-10-CM | POA: Diagnosis not present

## 2019-09-17 DIAGNOSIS — L239 Allergic contact dermatitis, unspecified cause: Secondary | ICD-10-CM | POA: Diagnosis not present

## 2019-09-17 DIAGNOSIS — L309 Dermatitis, unspecified: Secondary | ICD-10-CM | POA: Diagnosis not present

## 2019-09-28 DIAGNOSIS — N951 Menopausal and female climacteric states: Secondary | ICD-10-CM | POA: Diagnosis not present

## 2019-09-28 DIAGNOSIS — F39 Unspecified mood [affective] disorder: Secondary | ICD-10-CM | POA: Diagnosis not present

## 2019-09-28 DIAGNOSIS — Z0000 Encounter for general adult medical examination without abnormal findings: Secondary | ICD-10-CM | POA: Diagnosis not present

## 2019-09-28 DIAGNOSIS — R7303 Prediabetes: Secondary | ICD-10-CM | POA: Diagnosis not present

## 2019-09-28 DIAGNOSIS — Z1389 Encounter for screening for other disorder: Secondary | ICD-10-CM | POA: Diagnosis not present

## 2019-09-28 DIAGNOSIS — E7800 Pure hypercholesterolemia, unspecified: Secondary | ICD-10-CM | POA: Diagnosis not present

## 2019-09-28 DIAGNOSIS — K219 Gastro-esophageal reflux disease without esophagitis: Secondary | ICD-10-CM | POA: Diagnosis not present

## 2019-09-28 DIAGNOSIS — Z23 Encounter for immunization: Secondary | ICD-10-CM | POA: Diagnosis not present

## 2019-09-28 DIAGNOSIS — J309 Allergic rhinitis, unspecified: Secondary | ICD-10-CM | POA: Diagnosis not present

## 2019-09-28 DIAGNOSIS — I129 Hypertensive chronic kidney disease with stage 1 through stage 4 chronic kidney disease, or unspecified chronic kidney disease: Secondary | ICD-10-CM | POA: Diagnosis not present

## 2019-09-28 DIAGNOSIS — N183 Chronic kidney disease, stage 3 unspecified: Secondary | ICD-10-CM | POA: Diagnosis not present

## 2019-09-28 DIAGNOSIS — Z1211 Encounter for screening for malignant neoplasm of colon: Secondary | ICD-10-CM | POA: Diagnosis not present

## 2019-11-03 DIAGNOSIS — H52203 Unspecified astigmatism, bilateral: Secondary | ICD-10-CM | POA: Diagnosis not present

## 2019-11-03 DIAGNOSIS — H33321 Round hole, right eye: Secondary | ICD-10-CM | POA: Diagnosis not present

## 2019-11-03 DIAGNOSIS — E119 Type 2 diabetes mellitus without complications: Secondary | ICD-10-CM | POA: Diagnosis not present

## 2019-11-03 DIAGNOSIS — H524 Presbyopia: Secondary | ICD-10-CM | POA: Diagnosis not present

## 2019-11-03 DIAGNOSIS — H04123 Dry eye syndrome of bilateral lacrimal glands: Secondary | ICD-10-CM | POA: Diagnosis not present

## 2019-11-03 DIAGNOSIS — H25813 Combined forms of age-related cataract, bilateral: Secondary | ICD-10-CM | POA: Diagnosis not present

## 2019-11-03 DIAGNOSIS — H5211 Myopia, right eye: Secondary | ICD-10-CM | POA: Diagnosis not present

## 2019-11-03 DIAGNOSIS — H5212 Myopia, left eye: Secondary | ICD-10-CM | POA: Diagnosis not present

## 2019-11-03 DIAGNOSIS — H10413 Chronic giant papillary conjunctivitis, bilateral: Secondary | ICD-10-CM | POA: Diagnosis not present

## 2019-12-04 ENCOUNTER — Other Ambulatory Visit: Payer: Self-pay | Admitting: Family Medicine

## 2019-12-04 DIAGNOSIS — Z1231 Encounter for screening mammogram for malignant neoplasm of breast: Secondary | ICD-10-CM

## 2020-01-13 ENCOUNTER — Other Ambulatory Visit: Payer: Self-pay

## 2020-01-13 ENCOUNTER — Ambulatory Visit
Admission: RE | Admit: 2020-01-13 | Discharge: 2020-01-13 | Disposition: A | Discharge status: Home / Self Care | Payer: PPO | Source: Ambulatory Visit | Attending: Family Medicine | Admitting: Family Medicine

## 2020-01-13 DIAGNOSIS — Z1231 Encounter for screening mammogram for malignant neoplasm of breast: Secondary | ICD-10-CM

## 2020-04-06 DIAGNOSIS — R7303 Prediabetes: Secondary | ICD-10-CM | POA: Diagnosis not present

## 2020-04-06 DIAGNOSIS — R05 Cough: Secondary | ICD-10-CM | POA: Diagnosis not present

## 2020-04-06 DIAGNOSIS — N183 Chronic kidney disease, stage 3 unspecified: Secondary | ICD-10-CM | POA: Diagnosis not present

## 2020-04-06 DIAGNOSIS — I129 Hypertensive chronic kidney disease with stage 1 through stage 4 chronic kidney disease, or unspecified chronic kidney disease: Secondary | ICD-10-CM | POA: Diagnosis not present

## 2020-04-06 DIAGNOSIS — L03019 Cellulitis of unspecified finger: Secondary | ICD-10-CM | POA: Diagnosis not present

## 2020-04-06 DIAGNOSIS — E7800 Pure hypercholesterolemia, unspecified: Secondary | ICD-10-CM | POA: Diagnosis not present

## 2020-07-05 ENCOUNTER — Ambulatory Visit (INDEPENDENT_AMBULATORY_CARE_PROVIDER_SITE_OTHER): Payer: PPO

## 2020-07-05 ENCOUNTER — Encounter: Payer: Self-pay | Admitting: Orthopaedic Surgery

## 2020-07-05 ENCOUNTER — Other Ambulatory Visit: Payer: Self-pay

## 2020-07-05 ENCOUNTER — Ambulatory Visit: Payer: PPO | Admitting: Orthopaedic Surgery

## 2020-07-05 VITALS — Ht 60.0 in | Wt 189.0 lb

## 2020-07-05 DIAGNOSIS — G8929 Other chronic pain: Secondary | ICD-10-CM

## 2020-07-05 DIAGNOSIS — M25562 Pain in left knee: Secondary | ICD-10-CM

## 2020-07-05 MED ORDER — METHYLPREDNISOLONE ACETATE 40 MG/ML IJ SUSP
80.0000 mg | INTRAMUSCULAR | Status: AC | PRN
  Administered 2020-07-05: 80 mg via INTRA_ARTICULAR

## 2020-07-05 MED ORDER — BUPIVACAINE HCL 0.5 % IJ SOLN
2.0000 mL | INTRAMUSCULAR | Status: AC | PRN
  Administered 2020-07-05: 2 mL via INTRA_ARTICULAR

## 2020-07-05 MED ORDER — LIDOCAINE HCL 1 % IJ SOLN
2.0000 mL | INTRAMUSCULAR | Status: AC | PRN
  Administered 2020-07-05: 2 mL

## 2020-07-05 NOTE — Progress Notes (Addendum)
Office Visit Note   Patient: Evelyn Stevens           Date of Birth: September 21, 1946           MRN: [id number] Visit Date: 07/05/2020              Requested by: Kelton Pillar, MD [address] [place],  [place] [address] PCP: Kelton Pillar, MD   Assessment & Plan: Visit Diagnoses:  1. Chronic pain of left knee     Plan: Mrs. Hieronymus was seen in nearly 4 years ago for evaluation of acute onset of left knee pain.  She eventually had an MRI scan demonstrating a tear of the medial meniscus and some mild degenerative changes the patellofemoral joint and medial compartment.  She preferred not to have surgery and notes that over time it ventrally "resolved".  Within the past week she has had recurrence of diffuse left knee pain without obvious injury or trauma.  She is having pain beneath the "kneecap" as well as medially and laterally.  No history of fever or chills.  Does have some pain in the area of her back and lateral hip but she thinks that is "completely separate".  Films today reveal some mild to moderate changes of arthritis.  I am going to inject her knee from a diagnostic and therapeutic standpoint and monitor her response.  If no improvement I would consider another MRI scan. Mrs. Mirkin has a history of bilateral pes planus and has used full orthotic insoles in both of her shoes in the past.  She would like another pair which we will provide today  Follow-Up Instructions: Return if symptoms worsen or fail to improve.   Orders:  Orders Placed This Encounter  Procedures  . Large Joint Inj: L knee  . XR KNEE 3 VIEW LEFT   No orders of the defined types were placed in this encounter.     Procedures: Large Joint Inj: L knee on 07/05/2020 2:53 PM Indications: pain and diagnostic evaluation Details: 25 G 1.5 in needle, anteromedial approach  Arthrogram: No  Medications: 2 mL lidocaine 1 %; 2 mL bupivacaine 0.5 %; 80 mg methylPREDNISolone acetate 40  MG/ML Procedure, treatment alternatives, risks and benefits explained, specific risks discussed. Consent was given by the patient. Patient was prepped and draped in the usual sterile fashion.       Clinical Data: No additional findings.   Subjective: Chief Complaint  Patient presents with  . Left Knee - Pain  Patient presents today for left knee pain. She states that she woke up last week with pain in her left knee. No known injury. She said that it has been swollen. Gives way. Her pain is located anteriorly and laterally. She has difficulty with stairs. She takes Tylenol, but states that it does not help much. She has difficulty sleeping due to her knee. She also states that she occasional pain at her lateral thigh on the left leg.   HPI  Review of Systems  Constitutional: Negative for fatigue.  HENT: Negative for ear pain.   Eyes: Negative for pain.  Cardiovascular: Negative for leg swelling.  Gastrointestinal: Negative for constipation and diarrhea.  Endocrine: Negative for cold intolerance and heat intolerance.  Genitourinary: Negative for difficulty urinating.  Musculoskeletal: Positive for joint swelling.  Skin: Positive for rash.  Allergic/Immunologic: Negative for food allergies.  Neurological: Positive for weakness.  Hematological: Does not bruise/bleed easily.  Psychiatric/Behavioral: Positive for sleep disturbance.  Objective: Vital Signs: Ht 5' (1.524 m)   Wt 189 lb (85.7 kg)   BMI 36.91 kg/m   Physical Exam Constitutional:      Appearance: She is well-developed.  Eyes:     Pupils: Pupils are equal, round, and reactive to light.  Pulmonary:     Effort: Pulmonary effort is normal.  Skin:    General: Skin is warm and dry.  Neurological:     Mental Status: She is alert and oriented to person, place, and time.  Psychiatric:        Behavior: Behavior normal.     Ortho Exam large knees.  Neither knee was hot warm or red.  Not sure there is any  effusion.  No instability.  Left knee had pain to touch along the medial lateral compartments and about the patella.  There was patella crepitation but no particular pain with compression.  Full extension about 95 to 100 degrees of flexion without instability.  No popliteal pain.  No calf pain.  Straight leg raise negative.  Painless range of motion both hips.  Had some areas of tenderness over the lateral epicondyle of her left hip  Specialty Comments:  No specialty comments available.  Imaging: XR KNEE 3 VIEW LEFT  Result Date: 07/05/2020 Films of the left knee were obtained in several projections standing.  There is minimal osteophyte formation along the medial lateral femoral condyle.  No significant narrowing of the joint space.  Some mild sclerosis of the medial tibial plateau.  Also degenerative changes about the patellofemoral joint with some osteophyte formation.  No ectopic calcification or acute changes.  Films are consistent with moderate osteoarthritis    PMFS History: Patient Active Problem List   Diagnosis Date Noted  . Pain in left knee 07/05/2020   Past Medical History:  Diagnosis Date  . Hypertension   . Seasonal allergies     History reviewed. No pertinent family history.  History reviewed. No pertinent surgical history. Social History   Occupational History  . Not on file  Tobacco Use  . Smoking status: Never Smoker  . Smokeless tobacco: Never Used  Substance and Sexual Activity  . Alcohol use: No  . Drug use: No  . Sexual activity: Not on file

## 2020-08-24 ENCOUNTER — Other Ambulatory Visit: Payer: Self-pay

## 2020-09-12 ENCOUNTER — Telehealth: Payer: Self-pay | Admitting: Orthopaedic Surgery

## 2020-09-12 NOTE — Telephone Encounter (Signed)
Pt called and is still having issues with her knee,  Pt would like to know she can go ahead and be sent for an MRI

## 2020-09-12 NOTE — Telephone Encounter (Signed)
Please advise 

## 2020-09-12 NOTE — Telephone Encounter (Signed)
Ok for MRI.

## 2020-09-13 ENCOUNTER — Telehealth: Payer: Self-pay

## 2020-09-13 ENCOUNTER — Other Ambulatory Visit: Payer: Self-pay

## 2020-09-13 DIAGNOSIS — G8929 Other chronic pain: Secondary | ICD-10-CM

## 2020-09-13 NOTE — Telephone Encounter (Signed)
Spoke with patient. Ordered MRI and patient aware that [facility] will call her once insurance authorizes it. I ask her to call our office for a follow up for results once MRI is scheduled.

## 2020-09-13 NOTE — Telephone Encounter (Signed)
Patient called in wanting to know when should she be expecting a call to get her mri sch

## 2020-10-03 ENCOUNTER — Ambulatory Visit
Admission: RE | Admit: 2020-10-03 | Discharge: 2020-10-03 | Disposition: A | Discharge status: Home / Self Care | Payer: PPO | Source: Ambulatory Visit | Attending: Orthopaedic Surgery | Admitting: Orthopaedic Surgery

## 2020-10-03 DIAGNOSIS — M23342 Other meniscus derangements, anterior horn of lateral meniscus, left knee: Secondary | ICD-10-CM | POA: Diagnosis not present

## 2020-10-03 DIAGNOSIS — G8929 Other chronic pain: Secondary | ICD-10-CM

## 2020-10-03 DIAGNOSIS — M25562 Pain in left knee: Secondary | ICD-10-CM

## 2020-10-06 ENCOUNTER — Encounter: Payer: Self-pay | Admitting: Orthopaedic Surgery

## 2020-10-06 ENCOUNTER — Ambulatory Visit: Payer: PPO | Admitting: Orthopaedic Surgery

## 2020-10-06 ENCOUNTER — Other Ambulatory Visit: Payer: Self-pay

## 2020-10-06 DIAGNOSIS — M25562 Pain in left knee: Secondary | ICD-10-CM | POA: Diagnosis not present

## 2020-10-06 DIAGNOSIS — G8929 Other chronic pain: Secondary | ICD-10-CM

## 2020-10-06 MED ORDER — LIDOCAINE HCL 1 % IJ SOLN
2.0000 mL | INTRAMUSCULAR | Status: AC | PRN
  Administered 2020-10-06: 2 mL

## 2020-10-06 MED ORDER — BUPIVACAINE HCL 0.5 % IJ SOLN
2.0000 mL | INTRAMUSCULAR | Status: AC | PRN
  Administered 2020-10-06: 2 mL via INTRA_ARTICULAR

## 2020-10-06 NOTE — Progress Notes (Signed)
Office Visit Note   Patient: Evelyn Stevens           Date of Birth: 22-May-1946           MRN: [id number] Visit Date: 10/06/2020              Requested by: Kelton Pillar, MD [address] [place],  [place] [address] PCP: Kelton Pillar, MD   Assessment & Plan: Visit Diagnoses:  1. Chronic pain of left knee     Plan: Evelyn Stevens had an MRI scan of her left knee demonstrated progressive cartilage loss the patellofemoral joint as well as the lateral compartment.  She also had degenerative subchondral marrow edema in the lateral compartment without any suspicious bone lesions.  There was evidence of a chronic tear of the medial meniscus which she had on her MRI scan in 2017 and a new radial tear of the anterior horn of the lateral meniscus.  Long discussion with Evelyn Stevens regarding the above.  I believe that the majority of her pain is related to the arthritis in the subchondral edema in the lateral compartment.  I do not think that the menisci are symptomatic.  After much discussion she would like to try another cortisone injection which I will insert in the lateral compartment of her knee and then pre-CERT Visco supplementation.  She is taking Tylenol which makes a big difference and she can try a pullover knee support.  We discussed knee replacement but I think that she is wanting to try a more conservative approach at this point.  In addition we have ordered shoe inserts which are now in stock and will give those to her.  Follow-Up Instructions: Return Pre-CERT Visco supplementation.   Orders:  No orders of the defined types were placed in this encounter.  No orders of the defined types were placed in this encounter.     Procedures: Large Joint Inj: L knee on 10/06/2020 4:24 PM Indications: pain and diagnostic evaluation Details: 25 G 1.5 in needle, anteromedial approach  Arthrogram: No  Medications: 2 mL lidocaine 1 %; 2 mL bupivacaine 0.5 %  2 mL  betamethasone injected with Marcaine and Xylocaine Procedure, treatment alternatives, risks and benefits explained, specific risks discussed. Consent was given by the patient. Patient was prepped and draped in the usual sterile fashion.       Clinical Data: No additional findings.   Subjective: Chief Complaint  Patient presents with  . Left Knee - Pain  No change in symptoms.  Still having episodic pain in the left knee occasionally with her knee giving way.  She has had cortisone about 3 months ago which gave her some temporary relief of her pain and thus the reason for the MRI scan.  HPI  Review of Systems   Objective: Vital Signs: There were no vitals taken for this visit.  Physical Exam Constitutional:      Appearance: She is well-developed and well-nourished.  HENT:     Mouth/Throat:     Mouth: Oropharynx is clear and moist.  Eyes:     Extraocular Movements: EOM normal.     Pupils: Pupils are equal, round, and reactive to light.  Pulmonary:     Effort: Pulmonary effort is normal.  Skin:    General: Skin is warm and dry.  Neurological:     Mental Status: She is alert and oriented to person, place, and time.  Psychiatric:        Mood and  Affect: Mood and affect normal.        Behavior: Behavior normal.     Ortho Exam awake alert and oriented x3.  Comfortable sitting.  Large left knee.  Difficult to know if there was an effusion.  Having mostly lateral joint pain.  Little if any medial joint pain.  Full extension flexed about 90 degrees when calf with touch thigh.  No obvious opening with a varus valgus stress.  Negative anterior drawer sign.  No popliteal mass.  No calf pain.  Positive patella crepitation with some pain with compression.  Negative McMurray's. Specialty Comments:  No specialty comments available.  Imaging: No results found.   PMFS History: Patient Active Problem List   Diagnosis Date Noted  . Pain in left knee 07/05/2020   Past Medical  History:  Diagnosis Date  . Hypertension   . Seasonal allergies     History reviewed. No pertinent family history.  History reviewed. No pertinent surgical history. Social History   Occupational History  . Not on file  Tobacco Use  . Smoking status: Never Smoker  . Smokeless tobacco: Never Used  Substance and Sexual Activity  . Alcohol use: No  . Drug use: No  . Sexual activity: Not on file     Garald Balding, MD   Note - This record has been created using [employer].  Chart creation errors have been sought, but may not always  have been located. Such creation errors do not reflect on  the standard of medical care.

## 2020-11-01 ENCOUNTER — Telehealth: Payer: Self-pay

## 2020-11-01 ENCOUNTER — Telehealth: Payer: Self-pay | Admitting: Orthopaedic Surgery

## 2020-11-01 NOTE — Telephone Encounter (Signed)
Patient called. Would like to have gel injections in her knee. Her call back number is [phone]

## 2020-11-01 NOTE — Telephone Encounter (Signed)
Noted  

## 2020-11-01 NOTE — Telephone Encounter (Signed)
Are you okay with this?

## 2020-11-01 NOTE — Telephone Encounter (Signed)
Ok to pre cert

## 2020-11-01 NOTE — Telephone Encounter (Signed)
Spoke with patient. I have sent for precert for the left knee.

## 2020-11-01 NOTE — Telephone Encounter (Signed)
Please precert for left knee gel injections. This is Dr.Whitfield's patient. Thanks!

## 2020-11-15 ENCOUNTER — Telehealth: Payer: Self-pay | Admitting: Orthopaedic Surgery

## 2020-11-15 NOTE — Telephone Encounter (Signed)
Patient called. Says she called her insurance company about the [facility] for her injections. They stated they have not received anything from [facility] She would like Lauren to call her. [phone]

## 2020-11-15 NOTE — Telephone Encounter (Signed)
Hold for Lauren °

## 2020-11-16 ENCOUNTER — Telehealth: Payer: Self-pay | Admitting: Orthopaedic Surgery

## 2020-11-16 ENCOUNTER — Telehealth: Payer: Self-pay

## 2020-11-16 NOTE — Telephone Encounter (Signed)
Santiago Glad with [employer] called wanting to make sure we had the correct fax number since the pt called telling them we were faxing an authorization on 11/15/20 and they never received it.   Fax # [phone]  Santiago Glad also gave the number to check for PA  [phone]  Please call pt to update them when we have sent the autho.

## 2020-11-16 NOTE — Telephone Encounter (Signed)
Talked with patient concerning gel injection.  

## 2020-11-16 NOTE — Telephone Encounter (Signed)
Noted  

## 2020-11-16 NOTE — Telephone Encounter (Signed)
Hey! Please see below. Thanks!

## 2020-11-16 NOTE — Telephone Encounter (Signed)
See previous message concerning gel injection. 

## 2020-11-16 NOTE — Telephone Encounter (Signed)
VOB Submitted, Orthovisc, left knee.

## 2020-11-18 ENCOUNTER — Telehealth: Payer: Self-pay

## 2020-11-18 NOTE — Telephone Encounter (Signed)
Awaiting benefits from insurance.

## 2020-11-22 ENCOUNTER — Telehealth: Payer: Self-pay

## 2020-11-22 NOTE — Telephone Encounter (Signed)
Approved, Orthovisc series, left knee. Evelyn Stevens Patient will be responsible for 20% OOP. $30.00 Co-pay each visit No PA required  Appt. 12/01/2020 with Dr. Durward Fortes

## 2020-12-01 ENCOUNTER — Other Ambulatory Visit: Payer: Self-pay

## 2020-12-01 ENCOUNTER — Encounter: Payer: Self-pay | Admitting: Orthopaedic Surgery

## 2020-12-01 ENCOUNTER — Ambulatory Visit: Payer: PPO | Admitting: Orthopaedic Surgery

## 2020-12-01 DIAGNOSIS — M1712 Unilateral primary osteoarthritis, left knee: Secondary | ICD-10-CM

## 2020-12-01 MED ORDER — HYALURONAN 30 MG/2ML IX SOSY
30.0000 mg | PREFILLED_SYRINGE | INTRA_ARTICULAR | Status: AC | PRN
  Administered 2020-12-01: 30 mg via INTRA_ARTICULAR

## 2020-12-01 NOTE — Progress Notes (Signed)
   Office Visit Note   Patient: Evelyn Stevens           Date of Birth: 1946/07/14           MRN: [id number] Visit Date: 12/01/2020              Requested by: Kelton Pillar, MD [address] [place],  [place] [address] PCP: Kelton Pillar, MD   Assessment & Plan: Visit Diagnoses:  1. Unilateral primary osteoarthritis, left knee     Plan: First Orthovisc injection left knee.  Follow-up in the office weekly for the next 2 weeks to complete the series of 3  Follow-Up Instructions: Return in about 1 week (around 12/08/2020).   Orders:  Orders Placed This Encounter  Procedures  . Large Joint Inj: L knee   No orders of the defined types were placed in this encounter.     Procedures: Large Joint Inj: L knee on 12/01/2020 3:20 PM Indications: pain and joint swelling Details: 25 G 1.5 in needle, anteromedial approach  Arthrogram: No  Medications: 30 mg Hyaluronan 30 MG/2ML Outcome: tolerated well, no immediate complications Procedure, treatment alternatives, risks and benefits explained, specific risks discussed. Consent was given by the patient. Immediately prior to procedure a time out was called to verify the correct patient, procedure, equipment, support staff and site/side marked as required. Patient was prepped and draped in the usual sterile fashion.       Clinical Data: No additional findings.   Subjective: Chief Complaint  Patient presents with  . Left Knee - Follow-up    Orthovisc started 12/01/2020  Patient presents today for her left knee. She is here to get her first Orthovisc injection in her left knee.   HPI  Review of Systems   Objective: Vital Signs: Ht 5' (1.524 m)   Wt 189 lb (85.7 kg)   BMI 36.91 kg/m   Physical Exam  Ortho Exam left knee was not hot warm or red.  Large legs.  Full extension about 95 degrees of flexion without instability.  Mostly lateral joint pain  Specialty Comments:  No specialty comments  available.  Imaging: No results found.   PMFS History: Patient Active Problem List   Diagnosis Date Noted  . Unilateral primary osteoarthritis, left knee 12/01/2020  . Pain in left knee 07/05/2020   Past Medical History:  Diagnosis Date  . Hypertension   . Seasonal allergies     History reviewed. No pertinent family history.  History reviewed. No pertinent surgical history. Social History   Occupational History  . Not on file  Tobacco Use  . Smoking status: Never Smoker  . Smokeless tobacco: Never Used  Substance and Sexual Activity  . Alcohol use: No  . Drug use: No  . Sexual activity: Not on file

## 2020-12-06 ENCOUNTER — Other Ambulatory Visit: Payer: Self-pay

## 2020-12-06 ENCOUNTER — Telehealth: Payer: Self-pay | Admitting: Orthopaedic Surgery

## 2020-12-06 DIAGNOSIS — M1712 Unilateral primary osteoarthritis, left knee: Secondary | ICD-10-CM

## 2020-12-06 NOTE — Telephone Encounter (Signed)
Patient states that after getting her last injection, she wants Dr.Whitfield to approve her for physical therapy (Left knee) at [facility] in [place]. (The place she PT there before on her shoulder).    Patient;s call back # [phone] or [phone](cell)

## 2020-12-06 NOTE — Telephone Encounter (Signed)
Ordered and patient notified.  

## 2020-12-06 NOTE — Telephone Encounter (Signed)
OK for PT as requested 

## 2020-12-08 ENCOUNTER — Encounter: Payer: Self-pay | Admitting: Orthopaedic Surgery

## 2020-12-08 ENCOUNTER — Ambulatory Visit: Payer: PPO | Admitting: Orthopaedic Surgery

## 2020-12-08 ENCOUNTER — Other Ambulatory Visit: Payer: Self-pay

## 2020-12-08 DIAGNOSIS — M1712 Unilateral primary osteoarthritis, left knee: Secondary | ICD-10-CM | POA: Diagnosis not present

## 2020-12-08 MED ORDER — HYALURONAN 30 MG/2ML IX SOSY
30.0000 mg | PREFILLED_SYRINGE | INTRA_ARTICULAR | Status: AC | PRN
Start: 2020-12-08 — End: 2020-12-08
  Administered 2020-12-08: 30 mg via INTRA_ARTICULAR

## 2020-12-08 NOTE — Progress Notes (Signed)
   Office Visit Note   Patient: Evelyn Stevens           Date of Birth: 11/06/1945           MRN: [id number] Visit Date: 12/08/2020              Requested by: Kelton Pillar, MD [address] [place],  [place] [id number] PCP: Kelton Pillar, MD   Assessment & Plan: Visit Diagnoses:  1. Unilateral primary osteoarthritis, left knee     Plan: Second Orthovisc injection left knee long the anterior medial joint line. Doing well with the first injection. Return next week to complete the series of 3  Follow-Up Instructions: Return in about 1 week (around 12/15/2020).   Orders:  Orders Placed This Encounter  Procedures  . Large Joint Inj: L knee   No orders of the defined types were placed in this encounter.     Procedures: Large Joint Inj: L knee on 12/08/2020 4:21 PM Indications: pain and joint swelling Details: 25 G 1.5 in needle, anteromedial approach  Arthrogram: No  Medications: 30 mg Hyaluronan 30 MG/2ML Outcome: tolerated well, no immediate complications Procedure, treatment alternatives, risks and benefits explained, specific risks discussed. Consent was given by the patient. Immediately prior to procedure a time out was called to verify the correct patient, procedure, equipment, support staff and site/side marked as required. Patient was prepped and draped in the usual sterile fashion.       Clinical Data: No additional findings.   Subjective: Chief Complaint  Patient presents with  . Left Knee - Follow-up    Orthovisc started 12/01/2020  Patient presents today for the second Orthovisc injection in her left knee. She started the series on 12/01/2020. She said that her knee was more sore for a few days following the last injection, but that soreness has went away.   HPI  Review of Systems   Objective: Vital Signs: There were no vitals taken for this visit.  Physical Exam  Ortho Exam large knees. No obvious effusion left knee. Full extension  of flexed about 95 degrees without instability. No localized areas of tenderness  Specialty Comments:  No specialty comments available.  Imaging: No results found.   PMFS History: Patient Active Problem List   Diagnosis Date Noted  . Unilateral primary osteoarthritis, left knee 12/01/2020  . Pain in left knee 07/05/2020   Past Medical History:  Diagnosis Date  . Hypertension   . Seasonal allergies     History reviewed. No pertinent family history.  History reviewed. No pertinent surgical history. Social History   Occupational History  . Not on file  Tobacco Use  . Smoking status: Never Smoker  . Smokeless tobacco: Never Used  Substance and Sexual Activity  . Alcohol use: No  . Drug use: No  . Sexual activity: Not on file

## 2020-12-13 DIAGNOSIS — H25813 Combined forms of age-related cataract, bilateral: Secondary | ICD-10-CM | POA: Diagnosis not present

## 2020-12-13 DIAGNOSIS — H33321 Round hole, right eye: Secondary | ICD-10-CM | POA: Diagnosis not present

## 2020-12-13 DIAGNOSIS — H04123 Dry eye syndrome of bilateral lacrimal glands: Secondary | ICD-10-CM | POA: Diagnosis not present

## 2020-12-13 DIAGNOSIS — H10413 Chronic giant papillary conjunctivitis, bilateral: Secondary | ICD-10-CM | POA: Diagnosis not present

## 2020-12-13 DIAGNOSIS — E119 Type 2 diabetes mellitus without complications: Secondary | ICD-10-CM | POA: Diagnosis not present

## 2020-12-15 ENCOUNTER — Other Ambulatory Visit: Payer: Self-pay

## 2020-12-15 ENCOUNTER — Ambulatory Visit: Payer: PPO | Admitting: Orthopaedic Surgery

## 2020-12-15 ENCOUNTER — Ambulatory Visit: Payer: PPO | Admitting: Orthopedic Surgery

## 2020-12-15 DIAGNOSIS — M1712 Unilateral primary osteoarthritis, left knee: Secondary | ICD-10-CM

## 2020-12-18 ENCOUNTER — Encounter: Payer: Self-pay | Admitting: Orthopedic Surgery

## 2020-12-18 DIAGNOSIS — M1712 Unilateral primary osteoarthritis, left knee: Secondary | ICD-10-CM | POA: Diagnosis not present

## 2020-12-18 MED ORDER — LIDOCAINE HCL 1 % IJ SOLN
5.0000 mL | INTRAMUSCULAR | Status: AC | PRN
  Administered 2020-12-18: 5 mL

## 2020-12-18 MED ORDER — HYALURONAN 30 MG/2ML IX SOSY
30.0000 mg | PREFILLED_SYRINGE | INTRA_ARTICULAR | Status: AC | PRN
  Administered 2020-12-18: 30 mg via INTRA_ARTICULAR

## 2020-12-18 NOTE — Progress Notes (Signed)
   Procedure Note  Patient: Evelyn Stevens             Date of Birth: 08/08/1946           MRN: [id number]             Visit Date: 12/15/2020  Procedures: Visit Diagnoses:  1. Unilateral primary osteoarthritis, left knee     Large Joint Inj: L knee on 12/18/2020 12:10 AM Indications: diagnostic evaluation, joint swelling and pain Details: 18 G 1.5 in needle, superolateral approach  Arthrogram: No  Medications: 5 mL lidocaine 1 %; 30 mg Hyaluronan 30 MG/2ML Outcome: tolerated well, no immediate complications Procedure, treatment alternatives, risks and benefits explained, specific risks discussed. Consent was given by the patient. Immediately prior to procedure a time out was called to verify the correct patient, procedure, equipment, support staff and site/side marked as required. Patient was prepped and draped in the usual sterile fashion.

## 2021-01-09 DIAGNOSIS — M1712 Unilateral primary osteoarthritis, left knee: Secondary | ICD-10-CM | POA: Diagnosis not present

## 2021-02-08 DIAGNOSIS — M1712 Unilateral primary osteoarthritis, left knee: Secondary | ICD-10-CM | POA: Diagnosis not present

## 2021-03-13 DIAGNOSIS — M1712 Unilateral primary osteoarthritis, left knee: Secondary | ICD-10-CM | POA: Diagnosis not present

## 2021-03-30 ENCOUNTER — Other Ambulatory Visit: Payer: Self-pay | Admitting: Family Medicine

## 2021-03-30 DIAGNOSIS — Z1231 Encounter for screening mammogram for malignant neoplasm of breast: Secondary | ICD-10-CM

## 2021-03-31 DIAGNOSIS — N183 Chronic kidney disease, stage 3 unspecified: Secondary | ICD-10-CM | POA: Diagnosis not present

## 2021-03-31 DIAGNOSIS — Z23 Encounter for immunization: Secondary | ICD-10-CM | POA: Diagnosis not present

## 2021-03-31 DIAGNOSIS — I129 Hypertensive chronic kidney disease with stage 1 through stage 4 chronic kidney disease, or unspecified chronic kidney disease: Secondary | ICD-10-CM | POA: Diagnosis not present

## 2021-03-31 DIAGNOSIS — Z1211 Encounter for screening for malignant neoplasm of colon: Secondary | ICD-10-CM | POA: Diagnosis not present

## 2021-03-31 DIAGNOSIS — Z1389 Encounter for screening for other disorder: Secondary | ICD-10-CM | POA: Diagnosis not present

## 2021-03-31 DIAGNOSIS — R7303 Prediabetes: Secondary | ICD-10-CM | POA: Diagnosis not present

## 2021-03-31 DIAGNOSIS — E7800 Pure hypercholesterolemia, unspecified: Secondary | ICD-10-CM | POA: Diagnosis not present

## 2021-03-31 DIAGNOSIS — K219 Gastro-esophageal reflux disease without esophagitis: Secondary | ICD-10-CM | POA: Diagnosis not present

## 2021-03-31 DIAGNOSIS — F39 Unspecified mood [affective] disorder: Secondary | ICD-10-CM | POA: Diagnosis not present

## 2021-03-31 DIAGNOSIS — J309 Allergic rhinitis, unspecified: Secondary | ICD-10-CM | POA: Diagnosis not present

## 2021-03-31 DIAGNOSIS — Z0000 Encounter for general adult medical examination without abnormal findings: Secondary | ICD-10-CM | POA: Diagnosis not present

## 2021-04-10 DIAGNOSIS — M1712 Unilateral primary osteoarthritis, left knee: Secondary | ICD-10-CM | POA: Diagnosis not present

## 2021-05-24 ENCOUNTER — Ambulatory Visit
Admission: RE | Admit: 2021-05-24 | Discharge: 2021-05-24 | Disposition: A | Discharge status: Home / Self Care | Payer: PPO | Source: Ambulatory Visit | Attending: Family Medicine | Admitting: Family Medicine

## 2021-05-24 ENCOUNTER — Other Ambulatory Visit: Payer: Self-pay

## 2021-05-24 DIAGNOSIS — Z1231 Encounter for screening mammogram for malignant neoplasm of breast: Secondary | ICD-10-CM | POA: Diagnosis not present

## 2021-10-04 DIAGNOSIS — E7849 Other hyperlipidemia: Secondary | ICD-10-CM | POA: Diagnosis not present

## 2021-10-04 DIAGNOSIS — K219 Gastro-esophageal reflux disease without esophagitis: Secondary | ICD-10-CM | POA: Diagnosis not present

## 2021-10-04 DIAGNOSIS — R7303 Prediabetes: Secondary | ICD-10-CM | POA: Diagnosis not present

## 2021-10-04 DIAGNOSIS — G8929 Other chronic pain: Secondary | ICD-10-CM | POA: Diagnosis not present

## 2021-10-04 DIAGNOSIS — M545 Low back pain, unspecified: Secondary | ICD-10-CM | POA: Diagnosis not present

## 2021-10-04 DIAGNOSIS — I10 Essential (primary) hypertension: Secondary | ICD-10-CM | POA: Diagnosis not present

## 2021-10-04 DIAGNOSIS — Z23 Encounter for immunization: Secondary | ICD-10-CM | POA: Diagnosis not present

## 2021-10-04 DIAGNOSIS — J310 Chronic rhinitis: Secondary | ICD-10-CM | POA: Diagnosis not present

## 2021-10-04 DIAGNOSIS — Z1211 Encounter for screening for malignant neoplasm of colon: Secondary | ICD-10-CM | POA: Diagnosis not present

## 2021-10-04 DIAGNOSIS — F419 Anxiety disorder, unspecified: Secondary | ICD-10-CM | POA: Diagnosis not present

## 2023-07-30 DEATH — deceased
